# Patient Record
Sex: Female | Born: 1955 | Race: Black or African American | Hispanic: No | Marital: Single | Smoking: Never smoker
Health system: Southern US, Community
[De-identification: ages and names within clinical notes are randomized; demographics above are authoritative.]

---

## 2003-11-08 ENCOUNTER — Encounter: Admission: RE | Admit: 2003-11-08 | Discharge: 2003-11-08 | Payer: Self-pay | Admitting: Cardiology

## 2004-10-11 ENCOUNTER — Ambulatory Visit: Payer: Self-pay | Admitting: Internal Medicine

## 2004-10-15 ENCOUNTER — Ambulatory Visit: Payer: Self-pay | Admitting: Internal Medicine

## 2004-11-23 ENCOUNTER — Ambulatory Visit: Payer: Self-pay | Admitting: Cardiology

## 2004-12-01 ENCOUNTER — Ambulatory Visit: Payer: Self-pay | Admitting: Internal Medicine

## 2004-12-08 ENCOUNTER — Ambulatory Visit: Payer: Self-pay | Admitting: Unknown Physician Specialty

## 2005-03-10 ENCOUNTER — Ambulatory Visit: Payer: Self-pay | Admitting: General Surgery

## 2005-12-29 ENCOUNTER — Ambulatory Visit: Payer: Self-pay | Admitting: Internal Medicine

## 2006-01-13 ENCOUNTER — Ambulatory Visit: Payer: Self-pay | Admitting: Internal Medicine

## 2009-01-12 ENCOUNTER — Ambulatory Visit: Payer: Self-pay | Admitting: Internal Medicine

## 2009-02-26 ENCOUNTER — Ambulatory Visit: Payer: Self-pay | Admitting: Internal Medicine

## 2009-11-10 ENCOUNTER — Emergency Department (HOSPITAL_COMMUNITY): Admission: EM | Admit: 2009-11-10 | Discharge: 2009-11-10 | Payer: Self-pay | Admitting: Pediatric Emergency Medicine

## 2010-10-27 LAB — POCT I-STAT, CHEM 8
Calcium, Ion: 1.23 mmol/L (ref 1.12–1.32)
Chloride: 107 mEq/L (ref 96–112)
HCT: 45 % (ref 36.0–46.0)
Hemoglobin: 15.3 g/dL — ABNORMAL HIGH (ref 12.0–15.0)
TCO2: 25 mmol/L (ref 0–100)

## 2010-10-27 LAB — CBC
HCT: 42.3 % (ref 36.0–46.0)
MCHC: 33.9 g/dL (ref 30.0–36.0)
MCV: 90 fL (ref 78.0–100.0)
Platelets: 218 10*3/uL (ref 150–400)
RDW: 12.2 % (ref 11.5–15.5)

## 2010-10-27 LAB — DIFFERENTIAL
Basophils Absolute: 0 10*3/uL (ref 0.0–0.1)
Basophils Relative: 0 % (ref 0–1)
Eosinophils Absolute: 0 10*3/uL (ref 0.0–0.7)
Neutrophils Relative %: 81 % — ABNORMAL HIGH (ref 43–77)

## 2010-10-27 LAB — LACTIC ACID, PLASMA: Lactic Acid, Venous: 1.6 mmol/L (ref 0.5–2.2)
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
## 2011-12-21 ENCOUNTER — Ambulatory Visit: Payer: Self-pay | Admitting: Internal Medicine

## 2014-01-31 ENCOUNTER — Emergency Department: Payer: Self-pay | Admitting: Internal Medicine

## 2015-11-14 ENCOUNTER — Encounter: Payer: Self-pay | Admitting: Emergency Medicine

## 2015-11-14 ENCOUNTER — Emergency Department
Admission: EM | Admit: 2015-11-14 | Discharge: 2015-11-14 | Disposition: A | Discharge status: Home / Self Care | Payer: Self-pay | Attending: Emergency Medicine | Admitting: Emergency Medicine

## 2015-11-14 DIAGNOSIS — H8112 Benign paroxysmal vertigo, left ear: Secondary | ICD-10-CM | POA: Insufficient documentation

## 2015-11-14 DIAGNOSIS — Z791 Long term (current) use of non-steroidal anti-inflammatories (NSAID): Secondary | ICD-10-CM | POA: Insufficient documentation

## 2015-11-14 LAB — BASIC METABOLIC PANEL
ANION GAP: 3 — AB (ref 5–15)
BUN: 19 mg/dL (ref 6–20)
CALCIUM: 9.4 mg/dL (ref 8.9–10.3)
CO2: 26 mmol/L (ref 22–32)
Chloride: 110 mmol/L (ref 101–111)
Creatinine, Ser: 0.61 mg/dL (ref 0.44–1.00)
GFR calc Af Amer: >60 mL/min (ref >60)
GLUCOSE: 107 mg/dL — AB (ref 65–99)
POTASSIUM: 3.9 mmol/L (ref 3.5–5.1)
SODIUM: 139 mmol/L (ref 135–145)

## 2015-11-14 LAB — URINALYSIS COMPLETE WITH MICROSCOPIC (ARMC ONLY)
BACTERIA UA: NONE SEEN
BILIRUBIN URINE: NEGATIVE
GLUCOSE, UA: NEGATIVE mg/dL
HGB URINE DIPSTICK: NEGATIVE
Ketones, ur: NEGATIVE mg/dL
LEUKOCYTES UA: NEGATIVE
NITRITE: NEGATIVE
PH: 6 (ref 5.0–8.0)
Protein, ur: NEGATIVE mg/dL
RBC / HPF: NONE SEEN RBC/hpf (ref 0–5)
SPECIFIC GRAVITY, URINE: 1.015 (ref 1.005–1.030)

## 2015-11-14 LAB — CBC
HEMATOCRIT: 42.1 % (ref 35.0–47.0)
HEMOGLOBIN: 13.9 g/dL (ref 12.0–16.0)
MCH: 29.4 pg (ref 26.0–34.0)
MCHC: 32.9 g/dL (ref 32.0–36.0)
MCV: 89.2 fL (ref 80.0–100.0)
Platelets: 253 10*3/uL (ref 150–440)
RBC: 4.72 MIL/uL (ref 3.80–5.20)
RDW: 12.9 % (ref 11.5–14.5)
WBC: 5.5 10*3/uL (ref 3.6–11.0)

## 2015-11-14 MED ORDER — MECLIZINE HCL 25 MG PO TABS: 25.0000 mg | ORAL_TABLET | Freq: Three times a day (TID) | ORAL | Status: AC | PRN

## 2015-11-14 MED ORDER — MECLIZINE HCL 25 MG PO TABS
25.0000 mg | ORAL_TABLET | Freq: Once | ORAL | Status: AC
  Administered 2015-11-14: 25 mg via ORAL
  Filled 2015-11-14: qty 1

## 2015-11-14 MED ORDER — SODIUM CHLORIDE 0.9 % IV BOLUS (SEPSIS)
1000.0000 mL | Freq: Once | INTRAVENOUS | Status: AC
  Administered 2015-11-14: 1000 mL via INTRAVENOUS

## 2015-11-14 NOTE — Discharge Instructions (Signed)
Please seek medical attention for any high fevers, chest pain, shortness of breath, change in behavior, persistent vomiting, bloody stool or any other new or concerning symptoms. ° ° °Benign Positional Vertigo °Vertigo is the feeling that you or your surroundings are moving when they are not. Benign positional vertigo is the most common form of vertigo. The cause of this condition is not serious (is benign). This condition is triggered by certain movements and positions (is positional). This condition can be dangerous if it occurs while you are doing something that could endanger you or others, such as driving.  °CAUSES °In many cases, the cause of this condition is not known. It may be caused by a disturbance in an area of the inner ear that helps your brain to sense movement and balance. This disturbance can be caused by a viral infection (labyrinthitis), head injury, or repetitive motion. °RISK FACTORS °This condition is more likely to develop in: °· Women. °· People who are 50 years of age or older. °SYMPTOMS °Symptoms of this condition usually happen when you move your head or your eyes in different directions. Symptoms may start suddenly, and they usually last for less than a minute. Symptoms may include: °· Loss of balance and falling. °· Feeling like you are spinning or moving. °· Feeling like your surroundings are spinning or moving. °· Nausea and vomiting. °· Blurred vision. °· Dizziness. °· Involuntary eye movement (nystagmus). °Symptoms can be mild and cause only slight annoyance, or they can be severe and interfere with daily life. Episodes of benign positional vertigo may return (recur) over time, and they may be triggered by certain movements. Symptoms may improve over time. °DIAGNOSIS °This condition is usually diagnosed by medical history and a physical exam of the head, neck, and ears. You may be referred to a health care provider who specializes in ear, nose, and throat (ENT) problems  (otolaryngologist) or a provider who specializes in disorders of the nervous system (neurologist). You may have additional testing, including: °· MRI. °· A CT scan. °· Eye movement tests. Your health care provider may ask you to change positions quickly while he or she watches you for symptoms of benign positional vertigo, such as nystagmus. Eye movement may be tested with an electronystagmogram (ENG), caloric stimulation, the Dix-Hallpike test, or the roll test. °· An electroencephalogram (EEG). This records electrical activity in your brain. °· Hearing tests. °TREATMENT °Usually, your health care provider will treat this by moving your head in specific positions to adjust your inner ear back to normal. Surgery may be needed in severe cases, but this is rare. In some cases, benign positional vertigo may resolve on its own in 2-4 weeks. °HOME CARE INSTRUCTIONS °Safety °· Move slowly. Avoid sudden body or head movements. °· Avoid driving. °· Avoid operating heavy machinery. °· Avoid doing any tasks that would be dangerous to you or others if a vertigo episode would occur. °· If you have trouble walking or keeping your balance, try using a cane for stability. If you feel dizzy or unstable, sit down right away. °· Return to your normal activities as told by your health care provider. Ask your health care provider what activities are safe for you. °General Instructions °· Take over-the-counter and prescription medicines only as told by your health care provider. °· Avoid certain positions or movements as told by your health care provider. °· Drink enough fluid to keep your urine clear or pale yellow. °· Keep all follow-up visits as told by your health   care provider. This is important. °SEEK MEDICAL CARE IF: °· You have a fever. °· Your condition gets worse or you develop new symptoms. °· Your family or friends notice any behavioral changes. °· Your nausea or vomiting gets worse. °· You have numbness or a "pins and  needles" sensation. °SEEK IMMEDIATE MEDICAL CARE IF: °· You have difficulty speaking or moving. °· You are always dizzy. °· You faint. °· You develop severe headaches. °· You have weakness in your legs or arms. °· You have changes in your hearing or vision. °· You develop a stiff neck. °· You develop sensitivity to light. °  °This information is not intended to replace advice given to you by your health care provider. Make sure you discuss any questions you have with your health care provider. °  °Document Released: 05/02/2006 Document Revised: 04/15/2015 Document Reviewed: 11/17/2014 °[employer]vier Interactive Patient Education ©2016 [employer] ° °

## 2015-11-14 NOTE — ED Notes (Addendum)
Pt to ed with c/o carrying a box on Tuesday and lost her balance and almost fell but reports she twisted and felt like her back was injured.  Pt states this am was laying flat and became dizzy while laying flat.  Pt states dizziness is worse with laying flat and is relieved by sitting up.  Pt alert and oriented at this time.

## 2015-11-14 NOTE — ED Notes (Signed)
Patient given follow up information re: ENT doctor to follow up with in 2 days. Discussed prescription and how to take it.  Denies any further needs at this time.  Patient preferred to ambulate out to lobby.  C/o of some intermittent dizziness upon standing.
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
